# Patient Record
Sex: Male | Born: 1975 | Hispanic: No | Marital: Single | Smoking: Never smoker
Health system: Southern US, Community
[De-identification: ages and names within clinical notes are randomized; demographics above are authoritative.]

## PROBLEM LIST (undated history)

## (undated) DIAGNOSIS — T7840XA Allergy, unspecified, initial encounter: Secondary | ICD-10-CM

## (undated) DIAGNOSIS — K219 Gastro-esophageal reflux disease without esophagitis: Secondary | ICD-10-CM

## (undated) HISTORY — DX: Gastro-esophageal reflux disease without esophagitis: K21.9

## (undated) HISTORY — DX: Allergy, unspecified, initial encounter: T78.40XA

---

## 2004-10-04 ENCOUNTER — Ambulatory Visit: Payer: Self-pay | Admitting: Family Medicine

## 2004-11-11 ENCOUNTER — Ambulatory Visit: Payer: Self-pay | Admitting: Family Medicine

## 2004-11-28 ENCOUNTER — Ambulatory Visit: Payer: Self-pay | Admitting: Family Medicine

## 2006-02-07 ENCOUNTER — Ambulatory Visit: Payer: Self-pay | Admitting: Family Medicine

## 2006-12-10 ENCOUNTER — Ambulatory Visit: Payer: Self-pay | Admitting: Family Medicine

## 2007-11-29 ENCOUNTER — Encounter: Payer: Self-pay | Admitting: Internal Medicine

## 2007-11-29 ENCOUNTER — Ambulatory Visit: Payer: Self-pay | Admitting: Family Medicine

## 2007-12-06 ENCOUNTER — Ambulatory Visit: Payer: Self-pay | Admitting: Family Medicine

## 2007-12-09 ENCOUNTER — Telehealth: Payer: Self-pay | Admitting: Family Medicine

## 2008-01-27 ENCOUNTER — Ambulatory Visit: Payer: Self-pay | Admitting: Family Medicine

## 2008-01-27 LAB — CONVERTED CEMR LAB
Bilirubin Urine: NEGATIVE
Glucose, Urine, Semiquant: NEGATIVE
Ketones, urine, test strip: NEGATIVE
Urobilinogen, UA: 0.2
pH: 5

## 2010-04-22 ENCOUNTER — Ambulatory Visit: Payer: Self-pay | Admitting: Family Medicine

## 2010-07-11 ENCOUNTER — Ambulatory Visit: Payer: Self-pay | Admitting: Family Medicine

## 2010-08-15 ENCOUNTER — Ambulatory Visit: Payer: Self-pay | Admitting: Family Medicine

## 2010-11-16 NOTE — Assessment & Plan Note (Signed)
Summary: consult re: sleepy problems/cjr   Vital Signs:  Patient profile:   35 year old male Weight:      186 pounds Temp:     98.0 degrees F oral BP sitting:   120 / 80  (left arm) Cuff size:   regular  Vitals Entered By: Kathrynn Speed CMA (April 22, 2010 10:45 AM) CC: Consult sleep problems, problems going to sleep, wakes up sometime gasping for air, going on 4 mths /src   CC:  Consult sleep problems, problems going to sleep, wakes up sometime gasping for air, and going on 4 mths /src.  History of Present Illness: Tanner Bradshaw is a 35 year old, married male, nonsmoker, who comes in with a 15-month history of episodes of waking up at night short of breath.  He says every two weeks or so to wake up at night and feel like he can't breathe.  He has to gasp for air.  He has a slender build weight 186.  No history of airway, reflux, esophagitis, etc., etc.  Current Medications (verified): 1)  None  Allergies (verified): No Known Drug Allergies  Past History:  Past medical, surgical, family and social histories (including risk factors) reviewed, and no changes noted (except as noted below).  Past Medical History: Reviewed history from 06/24/2007 and no changes required. Unremarkable  Past Surgical History: Reviewed history from 06/24/2007 and no changes required. Denies surgical history  Family History: Reviewed history and no changes required.  Social History: Reviewed history from 11/29/2007 and no changes required. Occupation: Never Smoked Alcohol use-no Drug use-no  Review of Systems      See HPI  Physical Exam  General:  Well-developed,well-nourished,in no acute distress; alert,appropriate and cooperative throughout examination Head:  Normocephalic and atraumatic without obvious abnormalities. No apparent alopecia or balding. Eyes:  No corneal or conjunctival inflammation noted. EOMI. Perrla. Funduscopic exam benign, without hemorrhages, exudates or papilledema.  Vision grossly normal. Ears:  External ear exam shows no significant lesions or deformities.  Otoscopic examination reveals clear canals, tympanic membranes are intact bilaterally without bulging, retraction, inflammation or discharge. Hearing is grossly normal bilaterally. Nose:  External nasal examination shows no deformity or inflammation. Nasal mucosa are pink and moist without lesions or exudates. Mouth:  Oral mucosa and oropharynx without lesions or exudates.  Teeth in good repair. Neck:  No deformities, masses, or tenderness noted. Lungs:  Normal respiratory effort, chest expands symmetrically. Lungs are clear to auscultation, no crackles or wheezes.   Impression & Recommendations:  Problem # 1:  UNSPECIFIED SLEEP APNEA (ICD-780.57) Assessment New  Orders: Pulmonary Referral (Pulmonary)  Patient Instructions: 1)  take 20 mg of Prilosec prior to your evening meal, nothing to eat or drink for 3 hours before bedtime, avoid all caffeine, and peppermint, sleep on two pillows, and we will get you set up for a pulmonary consult ASAP

## 2010-11-16 NOTE — Assessment & Plan Note (Signed)
Summary: SINUSITIS? // RS   Vital Signs:  Patient profile:   35 year old male Height:      60 inches Weight:      186 pounds BMI:     36.46 Temp:     98.4 degrees F oral BP sitting:   110 / 80  (left arm) Cuff size:   regular  Vitals Entered By: Kern Reap CMA Duncan Dull) (August 15, 2010 1:41 PM) CC: sinus pressure   CC:  sinus pressure.  History of Present Illness: Tanner Bradshaw is a 71 -year-old, married male, nonsmoker, who comes in today because of allergic rhinitis.  He never had problems like this in the fall before however, this fall.  He said working around a lot of dust.  Review of systems negative  Allergies: No Known Drug Allergies  Past History:  Past medical, surgical, family and social histories (including risk factors) reviewed for relevance to current acute and chronic problems.  Past Medical History: Reviewed history from 06/24/2007 and no changes required. Unremarkable  Past Surgical History: Reviewed history from 06/24/2007 and no changes required. Denies surgical history  Family History: Reviewed history and no changes required.  Social History: Reviewed history from 11/29/2007 and no changes required. Occupation: Never Smoked Alcohol use-no Drug use-no  Review of Systems      See HPI  Physical Exam  General:  Well-developed,well-nourished,in no acute distress; alert,appropriate and cooperative throughout examination Head:  Normocephalic and atraumatic without obvious abnormalities. No apparent alopecia or balding. Eyes:  No corneal or conjunctival inflammation noted. EOMI. Perrla. Funduscopic exam benign, without hemorrhages, exudates or papilledema. Vision grossly normal. Ears:  External ear exam shows no significant lesions or deformities.  Otoscopic examination reveals clear canals, tympanic membranes are intact bilaterally without bulging, retraction, inflammation or discharge. Hearing is grossly normal bilaterally. Nose:  septum in the  midline, 4+ nasal edema Mouth:  Oral mucosa and oropharynx without lesions or exudates.  Teeth in good repair.   Problems:  Medical Problems Added: 1)  Dx of Rhinitis  (ICD-477.9)  Impression & Recommendations:  Problem # 1:  RHINITIS (ICD-477.9) Assessment New  His updated medication list for this problem includes:    Flonase 50 Mcg/act Susp (Fluticasone propionate) ..... Uad  Complete Medication List: 1)  Flonase 50 Mcg/act Susp (Fluticasone propionate) .... Uad  Patient Instructions: 1)  began plain Claritin, 10 mg in the morning .Marland Kitchen...or  plain Zyrtec 10 mg at bedtime. 2)  If after a week to 10 days.  He don't see much improvement, then add the steroid nasal spray, one shot up each nostril nightly Prescriptions: FLONASE 50 MCG/ACT SUSP (FLUTICASONE PROPIONATE) UAD  #1 x 11   Entered and Authorized by:   Roderick Pee MD   Signed by:   Roderick Pee MD on 08/15/2010   Method used:   Print then Give to Patient   RxID:   [id number]    Orders Added: 1)  Est. Patient Level III [14782]

## 2010-11-16 NOTE — Assessment & Plan Note (Signed)
Summary: CONSTANT LFT LEG PAIN/CJR   Vital Signs:  Patient profile:   35 year old male Weight:      189 pounds Temp:     97.9 degrees F BP sitting:   112 / 72  (right arm)  Vitals Entered By: Kathrynn Speed CMA (July 11, 2010 3:28 PM) CC: Constant pain in left leg x 4 days, pain in lower abd area started today, src Is Patient Diabetic? No   CC:  Constant pain in left leg x 4 days, pain in lower abd area started today, and src.  History of Present Illness: Tanner Bradshaw is a 35 year old male, who comes in today for pain in his left groin since last Thursday.  He did his normal workouts, Monday, Tuesday, Wednesday.  When he woke up Thursday morning and noticed pain in his left groin.  He does not recall doing anything unusual.  Review of systems  Preventive Screening-Counseling & Management  Alcohol-Tobacco     Smoking Status: never  Current Medications (verified): 1)  None  Allergies (verified): No Known Drug Allergies  Past History:  Past medical, surgical, family and social histories (including risk factors) reviewed for relevance to current acute and chronic problems.  Past Medical History: Reviewed history from 06/24/2007 and no changes required. Unremarkable  Past Surgical History: Reviewed history from 06/24/2007 and no changes required. Denies surgical history  Family History: Reviewed history and no changes required.  Social History: Reviewed history from 11/29/2007 and no changes required. Occupation: Never Smoked Alcohol use-no Drug use-no  Review of Systems      See HPI  Physical Exam  General:  Well-developed,well-nourished,in no acute distress; alert,appropriate and cooperative throughout examination Abdomen:  Bowel sounds positive,abdomen soft and non-tender without masses, organomegaly or hernias noted...........Marland Kitchentender to palpation left inguinal ligament.  No masses.  No hernias Genitalia:  Testes bilaterally descended without nodularity,  tenderness or masses. No scrotal masses or lesions. No penis lesions or urethral discharge.   Impression & Recommendations:  Problem # 1:  INGUINAL PAIN, LEFT (ICD-789.09) Assessment New  Patient Instructions: 1)  hold off on your exercise program. 2)  Motrin 600 mg 3 times a day with food. 3)  Return p.r.n.

## 2010-12-29 ENCOUNTER — Ambulatory Visit (INDEPENDENT_AMBULATORY_CARE_PROVIDER_SITE_OTHER): Payer: 59 | Admitting: Family Medicine

## 2010-12-29 ENCOUNTER — Encounter: Payer: Self-pay | Admitting: Family Medicine

## 2010-12-29 VITALS — BP 130/90 | Temp 98.3°F | Ht 70.0 in | Wt 186.0 lb

## 2010-12-29 DIAGNOSIS — R1011 Right upper quadrant pain: Secondary | ICD-10-CM

## 2010-12-29 NOTE — Progress Notes (Signed)
  Subjective:    Patient ID: Tanner Bradshaw, male    DOB: 1976/06/21, 35 y.o.   MRN: [id number]  HPI Tanner Bradshaw Is a delightful 35 year old male, nonsmoker, who comes in today for evaluation of right upper quadrant abdominal pain x 1 month.  He states about a month ago he began having right upper quadrant abdominal pain after eating.  He describes the pain.  It is sudden onset, dull, a 5 on a scale of one to 10 no radiation.  It lasts for 30 minutes and goes away.  He has a lot of gas and belching.  No fever, chills.  Family history negative for gallbladder disease.  No changes except he stopped all lactose products thinking it might be a lactase deficiency.  However, the dietary changes did not help   Review of Systems Negative    Objective:   Physical Exam    Well-developed well-nourished, male in no acute distress.  Examination abdomen his abdomen is flat.  The bowel sounds are normal.  No tenderness.  No palpable masses    Assessment & Plan:  PC right upper quadrant pain, rule out gallbladder disease.  Fat free diet.  Ultrasound of his gallbladder.  Follow-up after ultrasound

## 2010-12-29 NOTE — Patient Instructions (Signed)
Stay on complete a fat-free diet.  See me in today after the ultrasound for follow

## 2010-12-30 LAB — HIV ANTIBODY (ROUTINE TESTING W REFLEX): HIV: NONREACTIVE

## 2011-01-02 ENCOUNTER — Other Ambulatory Visit: Payer: Self-pay | Admitting: Family Medicine

## 2011-01-02 DIAGNOSIS — R1011 Right upper quadrant pain: Secondary | ICD-10-CM

## 2011-01-02 NOTE — Progress Notes (Signed)
patient  Is aware 

## 2011-01-09 ENCOUNTER — Ambulatory Visit
Admission: RE | Admit: 2011-01-09 | Discharge: 2011-01-09 | Disposition: A | Discharge status: Home / Self Care | Payer: 59 | Source: Ambulatory Visit | Attending: Family Medicine | Admitting: Family Medicine

## 2011-01-09 DIAGNOSIS — R1011 Right upper quadrant pain: Secondary | ICD-10-CM

## 2011-09-20 ENCOUNTER — Telehealth: Payer: Self-pay | Admitting: Family Medicine

## 2011-09-20 NOTE — Telephone Encounter (Signed)
Wants to be seen. Having urinary pain. Went to urgent care about 2 weeks ago. Still not any better. Please advise. Thanks.

## 2011-09-20 NOTE — Telephone Encounter (Signed)
Spoke with patient and an appointment made 

## 2011-09-21 ENCOUNTER — Encounter: Payer: Self-pay | Admitting: Family Medicine

## 2011-09-21 ENCOUNTER — Ambulatory Visit (INDEPENDENT_AMBULATORY_CARE_PROVIDER_SITE_OTHER): Payer: 59 | Admitting: Family Medicine

## 2011-09-21 VITALS — BP 110/70 | Temp 98.0°F | Wt 182.5 lb

## 2011-09-21 DIAGNOSIS — R300 Dysuria: Secondary | ICD-10-CM

## 2011-09-21 DIAGNOSIS — R109 Unspecified abdominal pain: Secondary | ICD-10-CM

## 2011-09-21 NOTE — Progress Notes (Signed)
  Subjective:    Patient ID: Tanner Bradshaw, male    DOB: 12-26-1975, 35 y.o.   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  He states that two weeks ago.  He went to the urgent care and saw Dr. Merla Riches who prescribed Cipro 500 b.i.d. For 10 days because of dysuria.  Lab tests were pending, and nobody ever called him back the reports.  He called the office and still cannot get any information.  Despite taking the Cipro twice a day.  His symptoms of dysuria have persisted.  No fever, chills, rash, discharge, etc..  A week ago, he began having right and left lower quadrant abdominal pain.  He describes the pain is constant, right left lower quadrant dull, a 4 on a scale of one to 10.  It does not radiate.  He said no fever, chills, nausea, vomiting, or diarrhea.  Family history negative for GI and GU disease  Review of Systems    General urologic, and GI review of systems otherwise negative.  Despite the abdominal pain.  He is able to exercise on a regular basis and the pain does not keep him awake at night.  Nor has it gotten worse since it started Objective:   Physical Exam Well-developed well-nourished, male in no acute distress.  Examination the abdomen shows the abdomen is flat.  The bowel sounds are normal.  There is some tenderness in the descending colon and the sigmoid colon.  No palpable masses.  No rebound.  Genitalia exam normal       Assessment & Plan:  Dysuria unknown etiology.  Right and left lower quadrant abdominal pain, unknown etiology.  Plan start with a GI consult

## 2011-09-21 NOTE — Patient Instructions (Signed)
We will get you set up for a consult and GI.  Continue your good health habits

## 2011-10-19 ENCOUNTER — Encounter: Payer: Self-pay | Admitting: Internal Medicine

## 2011-10-19 ENCOUNTER — Ambulatory Visit (INDEPENDENT_AMBULATORY_CARE_PROVIDER_SITE_OTHER): Payer: 59 | Admitting: Internal Medicine

## 2011-10-19 VITALS — BP 124/80 | HR 60 | Ht 70.0 in | Wt 190.0 lb

## 2011-10-19 DIAGNOSIS — R1032 Left lower quadrant pain: Secondary | ICD-10-CM

## 2011-10-19 DIAGNOSIS — R109 Unspecified abdominal pain: Secondary | ICD-10-CM

## 2011-10-19 DIAGNOSIS — R1030 Lower abdominal pain, unspecified: Secondary | ICD-10-CM | POA: Insufficient documentation

## 2011-10-19 NOTE — Patient Instructions (Addendum)
Avoid abdominal exercise for another month at least. We will review records and give you a call, if we haven't called by the end of next week please give [place] a call. Things should get better with some time.

## 2011-10-19 NOTE — Progress Notes (Signed)
Subjective:    Patient ID: Tanner Bradshaw, male    DOB: [birth year], 36 y.o.   MRN: [id number]  HPI The patient is a 36 year old [place] American man here for evaluation of lower, no pain. He describes problems with lower abdominal pains and actually some painful urination that started I think in late November. [facility] he was prescribed Cipro after a urinalysis was performed. It sounds like they were going to send out for a culture as well. He says he could never get the results of that. He took Cipro, he saw Dr. Tawanna Cooler in early December. He was still having some dysuria and lower, pain then. He was referred to me. At this point his abdominal pain is better, though he still has some left groin pain. He denies any dysuria at this point. There's been no bowel habit change. There were no clear triggers for his pains and they do not occur at night or disturbing sleep. They are sharp and may last for up to an hour in variable positions in the lower abdominal area though recently had a transient pain in the right upper quadrant.  He tells me that in November before this started he was doing a lot more abdominal crutches and exercises, he works out most days of the week with weights and cardia workup. At that point he was actually doing a large number of abdominal crutches even before he went to the gym, sometimes twice a day. Subsequently he stopped those. And he thinks that after he stopped those as when he began to have improvement. His appetite and eating or unaffected and have no effect on the symptoms. Her been no fever chills or other constitutional symptoms. He has not tried any therapy for his pain other than the Cipro he took as prescribed.  Review of his history is notable for different musculoskeletal type pains including chest pains, he has had right upper quadrant pain earlier this year for which he had an ultrasound of the abdomen which was unremarkable. He's  had some anal itching which responded the preparation H. Call the GI review of systems is negative.   Review of Systems As above, he has had some muscle pains in his legs and extremities as well. All other review of systems negative.    Objective:   Physical Exam General:  Well-developed, well-nourished and in no acute distress Eyes:  anicteric. ENT:   Mouth and posterior pharynx free of lesions.  Neck:   supple w/o thyromegaly or mass.  Lungs: Clear to auscultation bilaterally. Heart:  S1S2, no rubs, murmurs, gallops. Abdomen:  soft, non-tender, no hepatosplenomegaly, hernia, or mass and BS+.  Rectal: Normal anoderm, no rectal mass, prostate is normal GU:  Normal circumcised penis and testes, no herniae but slight right inguinal bulge with cogh Lymph:  no cervical or supraclavicular adenopathy. Extremities:   no edema Skin   no rash. Neuro:  A&O x 3.  Psych:  appropriate mood and  Affect.   Data Reviewed:  Have requested office notes and labs from [facility]      Assessment & Plan:   1. Lower abdominal pain   2. Left groin pain    I think his problems are likely abdominal wall and groin strains from is exercising. Her being since he is stopped. I do want to see what the urinalysis showed. If he had a UTI he could need a urology referral. He has also  had some dysuria in the past. I reassured him, there really no worrisome features anything here and he is improving. Once I review the information from [facility] I will call him back. He will avoid abdominal crutches for at least another month.

## 2012-01-19 ENCOUNTER — Ambulatory Visit (INDEPENDENT_AMBULATORY_CARE_PROVIDER_SITE_OTHER): Payer: 59 | Admitting: Family

## 2012-01-19 ENCOUNTER — Encounter: Payer: Self-pay | Admitting: Family

## 2012-01-19 VITALS — BP 118/78 | Temp 98.7°F | Wt 190.0 lb

## 2012-01-19 DIAGNOSIS — J0100 Acute maxillary sinusitis, unspecified: Secondary | ICD-10-CM

## 2012-01-19 DIAGNOSIS — R0982 Postnasal drip: Secondary | ICD-10-CM

## 2012-01-19 MED ORDER — AZITHROMYCIN 250 MG PO TABS: ORAL_TABLET | ORAL | Status: AC

## 2012-01-19 NOTE — Patient Instructions (Signed)
1. Zyrtec-D twice a day (OTC)  Sinusitis Sinuses are air pockets within the bones of your face. The growth of bacteria within a sinus leads to infection. The infection prevents the sinuses from draining. This infection is called sinusitis. SYMPTOMS  There will be different areas of pain depending on which sinuses have become infected.  The maxillary sinuses often produce pain beneath the eyes.   Frontal sinusitis may cause pain in the middle of the forehead and above the eyes.  Other problems (symptoms) include:  Toothaches.   Colored, pus-like (purulent) drainage from the nose.   Swelling, warmth, and tenderness over the sinus areas may be signs of infection.  TREATMENT  Sinusitis is most often determined by an exam.X-rays may be taken. If x-rays have been taken, make sure you obtain your results or find out how you are to obtain them. Your caregiver may give you medications (antibiotics). These are medications that will help kill the bacteria causing the infection. You may also be given a medication (decongestant) that helps to reduce sinus swelling.  HOME CARE INSTRUCTIONS   Only take over-the-counter or prescription medicines for pain, discomfort, or fever as directed by your caregiver.   Drink extra fluids. Fluids help thin the mucus so your sinuses can drain more easily.   Applying either moist heat or ice packs to the sinus areas may help relieve discomfort.   Use saline nasal sprays to help moisten your sinuses. The sprays can be found at your local drugstore.  SEEK IMMEDIATE MEDICAL CARE IF:  You have a fever.   You have increasing pain, severe headaches, or toothache.   You have nausea, vomiting, or drowsiness.   You develop unusual swelling around the face or trouble seeing.  MAKE SURE YOU:   Understand these instructions.   Will watch your condition.   Will get help right away if you are not doing well or get worse.  Document Released: 10/02/2005 Document  Revised: 09/21/2011 Document Reviewed: 05/01/2007 [facility] Patient Information 2012 [place], [place].

## 2012-01-19 NOTE — Progress Notes (Signed)
  Subjective:    Patient ID: Tanner Bradshaw, male    DOB: [birth year], 36 y.o.   MRN: [id number]  HPI 36 year old [place] American male, nonsmoker, patient of Dr. Tawanna Cooler is in today with complaints of cough, congestion, headache, sinus pressure x10 days. He hasn't taken over-the-counter Mucinex D. with no relief. Denies any lightheadedness, dizziness, chest pain, palpitations, shortness of breath or edema.   Review of Systems  Constitutional: Positive for fatigue.  HENT: Positive for nosebleeds, congestion, sore throat and sinus pressure.   Respiratory: Negative.   Cardiovascular: Negative.   Musculoskeletal: Negative.   Skin: Negative.   Neurological: Negative.   Hematological: Positive for adenopathy.  Psychiatric/Behavioral: Negative.    No past medical history on file.  History   Social History  . Marital Status: Single    Spouse Name: N/A    Number of Children: 1  . 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   Social History Main Topics  . Smoking status: Never Smoker   . Smokeless tobacco: Never Used  . Alcohol Use: No  . Drug Use: No  . Sexually Active: Not on file   Other Topics Concern  . Not on file   Social History Narrative   No caffeine     No past surgical history on file.  Family History  Problem Relation Age of Onset  . Diabetes Maternal Aunt   . Colon cancer Neg Hx     No Known Allergies  No current outpatient prescriptions on file prior to visit.    BP 118/78  Temp(Src) 98.7 F (37.1 C) (Oral)  Wt 190 lb (86.183 kg)chart    Objective:   Physical Exam  Constitutional: He is oriented to person, place, and time. He appears well-developed and well-nourished.  HENT:  Right Ear: External ear normal.  Left Ear: External ear normal.  Nose: Nose normal.  Mouth/Throat: Oropharynx is clear and moist.       Sinus tenderness to palpation of the maxillary sinus  Neck: Normal range of motion. Neck supple.    Cardiovascular: Normal rate, regular rhythm and normal heart sounds.   Pulmonary/Chest: Effort normal and breath sounds normal.  Musculoskeletal: Normal range of motion.  Neurological: He is alert and oriented to person, place, and time.  Skin: Skin is warm and dry.  Psychiatric: He has a normal mood and affect.          Assessment & Plan:  Assessment: Acute sinusitis, postnasal drip  Plan: Over-the-counter Zyrtec-D twice daily. Z-Pak as directed. Rest. Drink plenty of fluids. Patient call the office if symptoms worsen or persist, recheck as scheduled and when necessary.

## 2012-03-27 ENCOUNTER — Encounter: Payer: Self-pay | Admitting: Family Medicine

## 2012-03-27 ENCOUNTER — Ambulatory Visit (INDEPENDENT_AMBULATORY_CARE_PROVIDER_SITE_OTHER): Payer: 59 | Admitting: Family Medicine

## 2012-03-27 VITALS — BP 130/90 | Temp 97.8°F | Wt 196.0 lb

## 2012-03-27 DIAGNOSIS — R300 Dysuria: Secondary | ICD-10-CM

## 2012-03-27 DIAGNOSIS — L74519 Primary focal hyperhidrosis, unspecified: Secondary | ICD-10-CM

## 2012-03-27 DIAGNOSIS — L74512 Primary focal hyperhidrosis, palms: Secondary | ICD-10-CM

## 2012-03-27 LAB — POCT URINALYSIS DIPSTICK
Bilirubin, UA: NEGATIVE
Glucose, UA: NEGATIVE
Leukocytes, UA: NEGATIVE
Nitrite, UA: NEGATIVE

## 2012-03-27 MED ORDER — SULFAMETHOXAZOLE-TRIMETHOPRIM 800-160 MG PO TABS: ORAL_TABLET | ORAL | Status: DC

## 2012-03-27 MED ORDER — ALUMINUM CHLORIDE 20 % EX SOLN: Freq: Every day | CUTANEOUS | Status: DC

## 2012-03-27 NOTE — Progress Notes (Signed)
  Subjective:    Patient ID: [name], male    DOB: 04-Oct-1976, 36 y.o.   MRN: [id number]  HPI Tavius is a 36 year old male nonsmoker who comes in today for evaluation of dysuria x2 weeks  He's had no fever chills or back pain discharge etc. He just has a burning sensation of his urethra.  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

## 2012-03-27 NOTE — Patient Instructions (Signed)
Take the Septra one twice daily to bottle empty refills x1  Drysol small amounts once daily at bedtime

## 2012-05-06 ENCOUNTER — Encounter: Payer: Self-pay | Admitting: Family

## 2012-05-06 ENCOUNTER — Ambulatory Visit (INDEPENDENT_AMBULATORY_CARE_PROVIDER_SITE_OTHER): Payer: 59 | Admitting: Family

## 2012-05-06 VITALS — BP 110/80 | HR 64 | Temp 98.4°F | Wt 193.0 lb

## 2012-05-06 DIAGNOSIS — J029 Acute pharyngitis, unspecified: Secondary | ICD-10-CM

## 2012-05-06 DIAGNOSIS — K219 Gastro-esophageal reflux disease without esophagitis: Secondary | ICD-10-CM

## 2012-05-06 MED ORDER — OMEPRAZOLE 40 MG PO CPDR: 40.0000 mg | DELAYED_RELEASE_CAPSULE | Freq: Every day | ORAL | Status: DC

## 2012-05-06 NOTE — Patient Instructions (Signed)
Diet for GERD or PUD Nutrition therapy can help ease the discomfort of gastroesophageal reflux disease (GERD) and peptic ulcer disease (PUD).  HOME CARE INSTRUCTIONS   Eat your meals slowly, in a relaxed setting.   Eat 5 to 6 small meals per day.   If a food causes distress, stop eating it for a period of time.  FOODS TO AVOID  Coffee, regular or decaffeinated.   Cola beverages, regular or low calorie.   Tea, regular or decaffeinated.   Pepper.   Cocoa.   High fat foods, including meats.   Butter, margarine, hydrogenated oil (trans fats).   Peppermint or spearmint (if you have GERD).   Fruits and vegetables if not tolerated.   Alcohol.   Nicotine (smoking or chewing). This is one of the most potent stimulants to acid production in the gastrointestinal tract.   Any food that seems to aggravate your condition.  If you have questions regarding your diet, ask your caregiver or a registered dietitian. TIPS  Lying flat may make symptoms worse. Keep the head of your bed raised 6 to 9 inches (15 to 23 cm) by using a foam wedge or blocks under the legs of the bed.   Do not lay down until 3 hours after eating a meal.   Daily physical activity may help reduce symptoms.  MAKE SURE YOU:   Understand these instructions.   Will watch your condition.   Will get help right away if you are not doing well or get worse.  Document Released: 10/02/2005 Document Revised: 09/21/2011 Document Reviewed: 08/18/2011 [employer] Patient Information 2012 [employer]. 

## 2012-05-06 NOTE — Progress Notes (Signed)
  Subjective:    Patient ID: Tanner Bradshaw, male    DOB: 08/17/1976, 36 y.o.   MRN: [id number]  HPI 36 year old African American male, nonsmoker, patient of Dr. Tawanna Cooler is in today with complaints of feeling like he has a lump in the back of his throat. He also complains of some soreness. Has been having trouble controlling his reflux over the past one week. Believes it was triggered by spicy foods. He has seen increase in belching and burping but denies any fullness or bloating. Denies any sneezing coughing or congestion. Denies stress. Minimal caffeine intake.   Review of Systems  Constitutional: Negative.   HENT: Positive for sore throat. Negative for congestion, sneezing and postnasal drip.   Respiratory: Negative.   Cardiovascular: Negative.   Gastrointestinal: Negative for nausea, vomiting, diarrhea and constipation.  Musculoskeletal: Negative.   Skin: Negative.   Neurological: Negative.   Hematological: Negative.   Psychiatric/Behavioral: Negative.    No past medical history on file.  History   Social History  . Marital Status: Single    Spouse Name: N/A    Number of Children: 1  . 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   Social History Main Topics  . Smoking status: Never Smoker   . Smokeless tobacco: Never Used  . Alcohol Use: No  . Drug Use: No  . Sexually Active: Not on file   Other Topics Concern  . Not on file   Social History Narrative   No caffeine     No past surgical history on file.  Family History  Problem Relation Age of Onset  . Diabetes Maternal Aunt   . Colon cancer Neg Hx     No Known Allergies  Current Outpatient Prescriptions on File Prior to Visit  Medication Sig Dispense Refill  . aluminum chloride (DRYSOL) 20 % external solution Apply topically at bedtime.  60 mL  5  . omeprazole (PRILOSEC) 40 MG capsule Take 1 capsule (40 mg total) by mouth daily.  30 capsule  3  .  sulfamethoxazole-trimethoprim (BACTRIM DS,SEPTRA DS) 800-160 MG per tablet 1 by mouth twice a day to bottle empty  30 tablet  1    BP 110/80  Pulse 64  Temp 98.4 F (36.9 C) (Oral)  Wt 193 lb (87.544 kg)  SpO2 98%chart    Objective:   Physical Exam  Constitutional: He is oriented to person, place, and time. He appears well-developed and well-nourished.  HENT:  Right Ear: External ear normal.  Left Ear: External ear normal.       Pharynx moderately red but no exudate  Neck: Normal range of motion. Neck supple. No thyromegaly present.  Cardiovascular: Normal rate, regular rhythm and normal heart sounds.   Pulmonary/Chest: Effort normal and breath sounds normal.  Abdominal: Soft. Bowel sounds are normal.  Musculoskeletal: Normal range of motion.  Lymphadenopathy:    He has no cervical adenopathy.  Neurological: He is alert and oriented to person, place, and time.  Skin: Skin is warm and dry.  Psychiatric: He has a normal mood and affect.          Assessment & Plan:  Assessment: Pharyngitis likely a result of GERD  Plan: Omeprazole 40 mg once daily. Advise patient to call the office if symptoms worsen or persist. We'll consider an endoscopy if his symptoms persist. Recheck with Dr. Tawanna Cooler for complete physical exam in a month and sooner when necessary.

## 2012-05-15 ENCOUNTER — Other Ambulatory Visit: Payer: Self-pay | Admitting: Otolaryngology

## 2012-05-15 DIAGNOSIS — R6889 Other general symptoms and signs: Secondary | ICD-10-CM

## 2012-05-15 DIAGNOSIS — K219 Gastro-esophageal reflux disease without esophagitis: Secondary | ICD-10-CM

## 2012-05-15 DIAGNOSIS — J029 Acute pharyngitis, unspecified: Secondary | ICD-10-CM

## 2012-05-22 ENCOUNTER — Ambulatory Visit
Admission: RE | Admit: 2012-05-22 | Discharge: 2012-05-22 | Disposition: A | Discharge status: Home / Self Care | Payer: 59 | Source: Ambulatory Visit | Attending: Otolaryngology | Admitting: Otolaryngology

## 2012-05-22 DIAGNOSIS — R6889 Other general symptoms and signs: Secondary | ICD-10-CM

## 2012-05-22 DIAGNOSIS — J029 Acute pharyngitis, unspecified: Secondary | ICD-10-CM

## 2012-05-22 DIAGNOSIS — K219 Gastro-esophageal reflux disease without esophagitis: Secondary | ICD-10-CM

## 2012-07-12 ENCOUNTER — Ambulatory Visit: Payer: 59 | Admitting: Internal Medicine

## 2012-08-29 ENCOUNTER — Other Ambulatory Visit: Payer: Self-pay | Admitting: Family

## 2012-12-18 ENCOUNTER — Encounter (HOSPITAL_COMMUNITY): Payer: Self-pay | Admitting: *Deleted

## 2012-12-18 ENCOUNTER — Emergency Department (HOSPITAL_COMMUNITY)
Admission: EM | Admit: 2012-12-18 | Discharge: 2012-12-18 | Disposition: A | Discharge status: Home / Self Care | Payer: 59 | Source: Home / Self Care

## 2012-12-18 ENCOUNTER — Telehealth: Payer: Self-pay | Admitting: Family Medicine

## 2012-12-18 DIAGNOSIS — R42 Dizziness and giddiness: Secondary | ICD-10-CM

## 2012-12-18 DIAGNOSIS — R51 Headache: Secondary | ICD-10-CM

## 2012-12-18 DIAGNOSIS — R0789 Other chest pain: Secondary | ICD-10-CM

## 2012-12-18 DIAGNOSIS — N289 Disorder of kidney and ureter, unspecified: Secondary | ICD-10-CM

## 2012-12-18 DIAGNOSIS — I10 Essential (primary) hypertension: Secondary | ICD-10-CM

## 2012-12-18 LAB — POCT I-STAT, CHEM 8
Creatinine, Ser: 1.4 mg/dL — ABNORMAL HIGH (ref 0.50–1.35)
Hemoglobin: 16 g/dL (ref 13.0–17.0)

## 2012-12-18 NOTE — ED Notes (Signed)
C/o pain in L chest onset last Wednesday.  He states he was working out last Wed. Lifting weights and does not know if he pulled something.  Pain is dull but worse on palpation.  Had a dizzy spell on Thur and Sat. that lasted 1 minute, separate from the pain.  Taking Advil.  Feels lightheaded today.

## 2012-12-18 NOTE — ED Provider Notes (Signed)
History     CSN: [id number]  Arrival date & time 12/18/12  1538   First MD Initiated Contact with Patient 12/18/12 1638      Chief Complaint  Patient presents with  . Chest Pain    (Consider location/radiation/quality/duration/timing/severity/associated sxs/prior treatment) Patient is a 37 y.o. male presenting with chest pain.  Chest Pain Associated symptoms: no dizziness, no headache, no numbness and no weakness    This is a 37 year old male who presents essentially for a complaint of feeling lightheaded today. He had 2 spells of dizziness over the past week both occurring after he stood up from a seated position and walked. These spells were not associated with any shortness of breath, acute chest pain or palpitations. He did not feel like he was going to pass out. Today, he states, he was not dizzy but felt some lightheadedness throughout the day. He states he drinks a lot of water and urinates very frequently. He does not take any caffeinated beverages. He has never been told he has high blood pressure He is not having nausea vomiting diarrhea. He does not have any flulike symptoms or sinus issues. No ringing in his ears or earache. No dizziness upon turning his head. No headaches. He also states that he thinks he pulled a muscle while at the gym last week and has been having left-sided chest pain which is reproducible when palpated. He has stopped going to the gym and was taking one tablet of ibuprofen daily for 2 days but stopped it because it was not helping. This is a soreness which is present constantly and worse when he lays on the left side of his chest or elevates his left arm. He does have some occasional tingling in his left arm as well. He has not had any as tightness associated with shortness of breath diaphoresis or palpitations.  History reviewed. No pertinent past medical history.  History reviewed. No pertinent past surgical history.  Family History  Problem Relation  Age of Onset  . Diabetes Maternal Aunt   . Colon cancer Neg Hx     History  Substance Use Topics  . Smoking status: Never Smoker   . Smokeless tobacco: Never Used  . Alcohol Use: No      Review of Systems  Constitutional: Negative.   HENT: Negative.   Respiratory: Negative.   Cardiovascular: Positive for chest pain.  Gastrointestinal: Negative.   Genitourinary: Negative.   Musculoskeletal:       Left chest pain which is reproducible and exacerbated by certain movements  Skin: Negative.   Neurological: Positive for light-headedness. Negative for dizziness, tremors, seizures, syncope, facial asymmetry, speech difficulty, weakness, numbness and headaches.  Hematological: Negative.   Psychiatric/Behavioral: Negative.     Allergies  Review of patient's allergies indicates no known allergies.  Home Medications   Current Outpatient Rx  Name  Route  Sig  Dispense  Refill  . aluminum chloride (DRYSOL) 20 % external solution   Topical   Apply topically at bedtime.   60 mL   5   . omeprazole (PRILOSEC) 40 MG capsule      TAKE 1 CAPSULE (40 MG TOTAL) BY MOUTH DAILY.   30 capsule   3   . sulfamethoxazole-trimethoprim (BACTRIM DS,SEPTRA DS) 800-160 MG per tablet      1 by mouth twice a day to bottle empty   30 tablet   1     BP 136/90  Pulse 61  Temp(Src) 98.6 F (37 C) (Oral)  Resp 16  SpO2 98%  Physical Exam  ED Course  Procedures (including critical care time)  Labs Reviewed  POCT I-STAT, CHEM 8 - Abnormal; Notable for the following:    Creatinine, Ser 1.40 (*)    Calcium, Ion 1.29 (*)    All other components within normal limits   No results found.   1. Renal insufficiency   2. Light headedness   3. Chest pain, muscular   4. HTN (hypertension)       MDM  I am recommending that he followup with his family doctor in one week for a recheck of blood pressure and to compare current blood work which reveals renal insufficiency with prior blood  work. Lightheaded sensation as possibly coming from uncontrolled blood pressure but I am hesitant to start medications for him today and would like him to have a recheck. In regards to the muscular chest pain I recommended rest ice and heat and topical medication such as BenGay. At this point I have told him not to take any further NSAIDs.        Calvert Cantor, MD 12/18/12 [phone]

## 2012-12-18 NOTE — Telephone Encounter (Signed)
I spoke with pt and advised him to go to the ER, per Dr. Clent Ridges.

## 2012-12-18 NOTE — Telephone Encounter (Signed)
Patient Information:  Caller Name: Jaymond  Phone: [phone]  Patient: Tanner Bradshaw, Tanner Bradshaw  Gender: Male  DOB: 05-24-1976  Age: 37 Years  PCP: Kelle Darting [facility])  Office Follow Up:  Does the office need to follow up with this patient?: Yes  Instructions For The Office: Caller requesting office appt after 1 pm  vs ED.  Maybe reached @ [phone].  Thank You.  RN Note:  Caller rquesting an appt.  Advised would like him seen asap.  Caller at work and cannot make to office before 1pm.  Advised caller he may be instructed to go to ED.   Symptoms  Reason For Call & Symptoms: Chest Pain on an off for about a 1 week.  Started getting light headed the past 2 days.  Chest pain on left uppper chest area.  Tender to the touch.  No shortness of breath.   Reviewed Health History In EMR: Yes  Reviewed Medications In EMR: Yes  Reviewed Allergies In EMR: Yes  Reviewed Surgeries / Procedures: Yes  Date of Onset of Symptoms: 12/11/2012  Treatments Tried: Advil  Treatments Tried Worked: No  Guideline(s) Used:  Chest Pain  Disposition Per Guideline:   Go to ED Now (or to Office with PCP Approval)  Reason For Disposition Reached:   Dizziness or lightheadedness  Advice Given:  Call Back If:  Severe chest pain  Constant chest pain lasting longer than 5 minutes  Difficulty breathing  You become worse.

## 2012-12-26 ENCOUNTER — Encounter: Payer: Self-pay | Admitting: Internal Medicine

## 2012-12-26 ENCOUNTER — Ambulatory Visit (INDEPENDENT_AMBULATORY_CARE_PROVIDER_SITE_OTHER): Payer: 59 | Admitting: Internal Medicine

## 2012-12-26 VITALS — BP 130/90 | HR 64 | Temp 98.0°F | Resp 18 | Wt 199.0 lb

## 2012-12-26 DIAGNOSIS — R42 Dizziness and giddiness: Secondary | ICD-10-CM

## 2012-12-26 DIAGNOSIS — R51 Headache: Secondary | ICD-10-CM

## 2012-12-26 NOTE — Progress Notes (Signed)
Subjective:    Patient ID: Tanner Bradshaw, male    DOB: 1975-12-07, 37 y.o.   MRN: [id number]  HPI   37 year old patient who is seen today in followup. He was seen at the urgent care 8 days ago complaining of headaches and dizziness. He was noted have a slightly elevated blood pressure as well as a creatinine of 1.4. At the present time his headaches have resolved and he only has some very mild nonspecific lightheadedness. This also seems to be improving. He takes no chronic medications. He does spend time at her health club and takes a number of supplements including protein supplements. He does not feel the protein supplements include creatine but he is unsure. Prior to his urgent care visit he was taken melatonin which he has discontinued. No family history of hypertension or chronic kidney disease  History reviewed. No pertinent past medical history.  History   Social History  . Marital Status: Single    Spouse Name: N/A    Number of Children: 1  . 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   Social History Main Topics  . Smoking status: Never Smoker   . Smokeless tobacco: Never Used  . Alcohol Use: No  . Drug Use: No  . Sexually Active: Not on file   Other Topics Concern  . Not on file   Social History Narrative   No caffeine     History reviewed. No pertinent past surgical history.  Family History  Problem Relation Age of Onset  . Diabetes Maternal Aunt   . Colon cancer Neg Hx     No Known Allergies  Current Outpatient Prescriptions on File Prior to Visit  Medication Sig Dispense Refill  . omeprazole (PRILOSEC) 40 MG capsule TAKE 1 CAPSULE (40 MG TOTAL) BY MOUTH DAILY.  30 capsule  3   No current facility-administered medications on file prior to visit.    BP 130/90  Pulse 64  Temp(Src) 98 F (36.7 C) (Oral)  Resp 18  Wt 199 lb (90.266 kg)  BMI 28.55 kg/m2  SpO2 99%       Review of Systems  Constitutional:  Negative for fever, chills, appetite change and fatigue.  HENT: Negative for hearing loss, ear pain, congestion, sore throat, trouble swallowing, neck stiffness, dental problem, voice change and tinnitus.   Eyes: Negative for pain, discharge and visual disturbance.  Respiratory: Negative for cough, chest tightness, wheezing and stridor.   Cardiovascular: Negative for chest pain, palpitations and leg swelling.  Gastrointestinal: Negative for nausea, vomiting, abdominal pain, diarrhea, constipation, blood in stool and abdominal distention.  Genitourinary: Negative for urgency, hematuria, flank pain, discharge, difficulty urinating and genital sores.  Musculoskeletal: Negative for myalgias, back pain, joint swelling, arthralgias and gait problem.  Skin: Negative for rash.  Neurological: Positive for light-headedness and headaches. Negative for dizziness, syncope, speech difficulty, weakness and numbness.  Hematological: Negative for adenopathy. Does not bruise/bleed easily.  Psychiatric/Behavioral: Negative for behavioral problems and dysphoric mood. The patient is not nervous/anxious.        Objective:   Physical Exam  Constitutional: He is oriented to person, place, and time. He appears well-developed and well-nourished. No distress.  Muscular athletic build.  Blood pressure 130/90  HENT:  Head: Normocephalic.  Right Ear: External ear normal.  Left Ear: External ear normal.  Eyes: Conjunctivae and EOM are normal.  Neck: Normal range of motion.  Cardiovascular: Normal rate and normal heart sounds.  Pulmonary/Chest: Breath sounds normal.  Abdominal: Bowel sounds are normal.  Musculoskeletal: Normal range of motion. He exhibits no edema and no tenderness.  Neurological: He is alert and oriented to person, place, and time.  Psychiatric: He has a normal mood and affect. His behavior is normal.          Assessment & Plan:   Nonspecific lightheadedness. This seems to be improving  possible resolving viral labyrinthitis History of elevated creatinine 1.4. The patient has been asked to discontinue all supplements.  Patient probably runs a high normal creatinine based on his athletic muscular build. We'll asked return in one month for followup of his blood pressure and to repeat renal indices  Patient asked to monitor home blood pressure readings. Blood pressure reassessed in one month Low-salt diet recommended

## 2012-12-26 NOTE — Patient Instructions (Addendum)
Limit your sodium (Salt) intake  Please check your blood pressure on a regular basis.  If it is consistently greater than 150/90, please make an office appointment.  Followup with Dr. Tawanna Cooler in one month  Discontinue all protein supplements

## 2013-02-06 ENCOUNTER — Ambulatory Visit: Payer: 59 | Admitting: Family Medicine

## 2013-02-15 ENCOUNTER — Ambulatory Visit (INDEPENDENT_AMBULATORY_CARE_PROVIDER_SITE_OTHER): Payer: 59 | Admitting: Internal Medicine

## 2013-02-15 VITALS — BP 157/77 | HR 61 | Temp 98.0°F | Resp 16 | Ht 70.5 in | Wt 193.0 lb

## 2013-02-15 DIAGNOSIS — K1379 Other lesions of oral mucosa: Secondary | ICD-10-CM

## 2013-02-15 DIAGNOSIS — H9209 Otalgia, unspecified ear: Secondary | ICD-10-CM

## 2013-02-15 DIAGNOSIS — K1370 Unspecified lesions of oral mucosa: Secondary | ICD-10-CM

## 2013-02-15 DIAGNOSIS — H9202 Otalgia, left ear: Secondary | ICD-10-CM

## 2013-02-15 MED ORDER — IBUPROFEN 600 MG PO TABS: 600.0000 mg | ORAL_TABLET | Freq: Three times a day (TID) | ORAL | Status: DC | PRN

## 2013-02-15 MED ORDER — AMOXICILLIN 500 MG PO CAPS: 1000.0000 mg | ORAL_CAPSULE | Freq: Two times a day (BID) | ORAL | Status: DC

## 2013-02-15 NOTE — Patient Instructions (Signed)

## 2013-02-15 NOTE — Progress Notes (Signed)
  Subjective:    Patient ID: Tanner Bradshaw, male    DOB: 05/16/1976, 37 y.o.   MRN: [id number]  HPI Patient presents today with left sided dental pain and earache.  Patient believes he has an infection in his teeth on left side.  Patient states the pain has persisted since Monday or Tuesday.  Patient was using ibuprofen to ease the pain but it only eased the pain slightly.  Patient states this happened before a few years ago and he got an antibiotic and it cleared up the pain.    Review of Systems     Objective:   Physical Exam        Assessment & Plan:

## 2013-02-15 NOTE — Progress Notes (Signed)
  Subjective:    Patient ID: Tanner Bradshaw, male    DOB: [birth year], 37 y.o.   MRN: [id number]  HPI Hx of dental pain in past same area. No swelling, bleeding, fever. No signs of cavity. Pain is in area where wisdom tooth removed years ago.   Review of Systems healthy    Objective:   Physical Exam  Constitutional: He is oriented to person, place, and time. He appears well-developed and well-nourished.  HENT:  Right Ear: External ear normal.  Left Ear: External ear normal.  Nose: Nose normal.  Mouth/Throat: Oropharynx is clear and moist.  Eyes: Pupils are equal, round, and reactive to light.  Neck: Normal range of motion. Neck supple.  Cardiovascular: Normal rate.   Pulmonary/Chest: Effort normal.  Lymphadenopathy:    He has no cervical adenopathy.  Neurological: He is alert and oriented to person, place, and time. He exhibits normal muscle tone. Coordination normal.  Skin: Skin is warm. No rash noted. No erythema.  Psychiatric: He has a normal mood and affect.  Tender to palpate mandible at area, gums tender not red or swollen        Assessment & Plan:  Amoxil/Motrin See dentist

## 2013-07-16 ENCOUNTER — Ambulatory Visit (INDEPENDENT_AMBULATORY_CARE_PROVIDER_SITE_OTHER): Payer: 59 | Admitting: Family

## 2013-07-16 ENCOUNTER — Encounter: Payer: Self-pay | Admitting: Family

## 2013-07-16 VITALS — BP 120/64 | HR 72 | Wt 195.0 lb

## 2013-07-16 DIAGNOSIS — S29011A Strain of muscle and tendon of front wall of thorax, initial encounter: Secondary | ICD-10-CM

## 2013-07-16 DIAGNOSIS — IMO0002 Reserved for concepts with insufficient information to code with codable children: Secondary | ICD-10-CM

## 2013-07-16 MED ORDER — DICLOFENAC SODIUM 75 MG PO TBEC: 75.0000 mg | DELAYED_RELEASE_TABLET | Freq: Two times a day (BID) | ORAL | Status: DC

## 2013-07-16 MED ORDER — CYCLOBENZAPRINE HCL 10 MG PO TABS: 10.0000 mg | ORAL_TABLET | Freq: Three times a day (TID) | ORAL | Status: DC | PRN

## 2013-07-16 NOTE — Patient Instructions (Addendum)

## 2013-07-17 ENCOUNTER — Encounter: Payer: Self-pay | Admitting: Family

## 2013-07-17 NOTE — Progress Notes (Signed)
  Subjective:    Patient ID: Tanner Bradshaw, male    DOB: [birth year], 37 y.o.   MRN: [id number]  HPI 37 year old AAM, nonsmoker, is in today with c/o left chest wall pain x 1 week after intense weight lifting. Pain is worse with movement. Better with ice. Rates pain 5-6/10. Has not taken any OTC medications.    Review of Systems  Constitutional: Negative.   Respiratory: Negative.   Cardiovascular: Negative for palpitations and leg swelling.  Musculoskeletal: Positive for myalgias.       Left chest wall pain  Skin: Negative.   Neurological: Negative.   Psychiatric/Behavioral: Negative.    Past Medical History  Diagnosis Date  . Allergy   . GERD (gastroesophageal reflux disease)     History   Social History  . Marital Status: Single    Spouse Name: N/A    Number of Children: 1  . 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   Social History Main Topics  . Smoking status: Never Smoker   . Smokeless tobacco: Never Used  . Alcohol Use: No  . Drug Use: No  . Sexual Activity: Not on file   Other Topics Concern  . Not on file   Social History Narrative   No caffeine     History reviewed. No pertinent past surgical history.  Family History  Problem Relation Age of Onset  . Diabetes Maternal Aunt   . Colon cancer Neg Hx     No Known Allergies  Current Outpatient Prescriptions on File Prior to Visit  Medication Sig Dispense Refill  . omeprazole (PRILOSEC) 40 MG capsule TAKE 1 CAPSULE (40 MG TOTAL) BY MOUTH DAILY.  30 capsule  3  . amoxicillin (AMOXIL) 500 MG capsule Take 2 capsules (1,000 mg total) by mouth 2 (two) times daily.  40 capsule  0  . Ascorbic Acid (VITAMIN C) 1000 MG tablet Take 1,000 mg by mouth daily.      Marland Kitchen ibuprofen (ADVIL,MOTRIN) 600 MG tablet Take 1 tablet (600 mg total) by mouth every 8 (eight) hours as needed for pain.  30 tablet  0  . Multiple Vitamin (MULTI-VITAMINS PO) Take 1 tablet by mouth daily.       No  current facility-administered medications on file prior to visit.    BP 120/64  Pulse 72  Wt 195 lb (88.451 kg)  BMI 27.57 kg/m2chart     Objective:   Physical Exam  Constitutional: He is oriented to person, place, and time. He appears well-developed and well-nourished.  Neck: Normal range of motion. Neck supple.  Cardiovascular: Normal rate, regular rhythm and normal heart sounds.   Pulmonary/Chest: Effort normal and breath sounds normal.  Musculoskeletal: He exhibits tenderness.  Left chest wall pain to palpation. Pain with chest flexion and extension.   Neurological: He is alert and oriented to person, place, and time.  Skin: Skin is warm and dry.  Psychiatric: He has a normal mood and affect.          Assessment & Plan:  Assessment: 1. Left Chest Wall Pain 2. Muscle Strain  Plan: Flexeril 10mg  three times a day and Voltaren 75mg  twice a day. Call the office if symptoms worsen or persist. Recheck as scheduled and as needed. Ice to the AA twice a day x 15-20.

## 2013-11-25 ENCOUNTER — Encounter: Payer: Self-pay | Admitting: *Deleted

## 2013-11-25 ENCOUNTER — Other Ambulatory Visit (INDEPENDENT_AMBULATORY_CARE_PROVIDER_SITE_OTHER): Payer: 59

## 2013-11-25 DIAGNOSIS — Z0000 Encounter for general adult medical examination without abnormal findings: Secondary | ICD-10-CM

## 2013-11-25 LAB — LIPID PANEL
Cholesterol: 134 mg/dL (ref 0–200)
HDL: 36.2 mg/dL — AB (ref >39.00)
LDL Cholesterol: 79 mg/dL (ref 0–99)
TRIGLYCERIDES: 92 mg/dL (ref 0.0–149.0)
Total CHOL/HDL Ratio: 4
VLDL: 18.4 mg/dL (ref 0.0–40.0)

## 2013-11-25 LAB — HEPATIC FUNCTION PANEL
ALBUMIN: 4.3 g/dL (ref 3.5–5.2)
ALK PHOS: 76 U/L (ref 39–117)
ALT: 39 U/L (ref 0–53)
AST: 29 U/L (ref 0–37)
BILIRUBIN DIRECT: 0 mg/dL (ref 0.0–0.3)
TOTAL PROTEIN: 7.7 g/dL (ref 6.0–8.3)
Total Bilirubin: 0.6 mg/dL (ref 0.3–1.2)

## 2013-11-25 LAB — POCT URINALYSIS DIPSTICK
BILIRUBIN UA: NEGATIVE
Glucose, UA: NEGATIVE
KETONES UA: NEGATIVE
LEUKOCYTES UA: NEGATIVE
Nitrite, UA: NEGATIVE
PH UA: 5.5
PROTEIN UA: NEGATIVE
RBC UA: NEGATIVE
Spec Grav, UA: 1.025
Urobilinogen, UA: 0.2

## 2013-11-25 LAB — CBC WITH DIFFERENTIAL/PLATELET
BASOS ABS: 0 10*3/uL (ref 0.0–0.1)
Basophils Relative: 0.4 % (ref 0.0–3.0)
EOS PCT: 3.1 % (ref 0.0–5.0)
Eosinophils Absolute: 0.2 10*3/uL (ref 0.0–0.7)
HCT: 44 % (ref 39.0–52.0)
Hemoglobin: 14.8 g/dL (ref 13.0–17.0)
Lymphocytes Relative: 31.7 % (ref 12.0–46.0)
Lymphs Abs: 2 10*3/uL (ref 0.7–4.0)
MCHC: 33.6 g/dL (ref 30.0–36.0)
MCV: 86 fl (ref 78.0–100.0)
MONO ABS: 0.4 10*3/uL (ref 0.1–1.0)
MONOS PCT: 6.3 % (ref 3.0–12.0)
NEUTROS PCT: 58.5 % (ref 43.0–77.0)
Neutro Abs: 3.7 10*3/uL (ref 1.4–7.7)
PLATELETS: 294 10*3/uL (ref 150.0–400.0)
RBC: 5.11 Mil/uL (ref 4.22–5.81)
RDW: 12.9 % (ref 11.5–14.6)
WBC: 6.3 10*3/uL (ref 4.5–10.5)

## 2013-11-25 LAB — BASIC METABOLIC PANEL
BUN: 16 mg/dL (ref 6–23)
CO2: 27 meq/L (ref 19–32)
Calcium: 9.1 mg/dL (ref 8.4–10.5)
Chloride: 106 mEq/L (ref 96–112)
Creatinine, Ser: 1.3 mg/dL (ref 0.4–1.5)
GFR: 65.05 mL/min (ref >60.00)
GLUCOSE: 96 mg/dL (ref 70–99)
POTASSIUM: 4 meq/L (ref 3.5–5.1)
SODIUM: 140 meq/L (ref 135–145)

## 2013-11-25 LAB — TSH: TSH: 1.45 u[IU]/mL (ref 0.35–5.50)

## 2013-12-02 ENCOUNTER — Encounter: Payer: 59 | Admitting: Family Medicine

## 2014-01-21 ENCOUNTER — Ambulatory Visit (INDEPENDENT_AMBULATORY_CARE_PROVIDER_SITE_OTHER): Payer: 59 | Admitting: Family Medicine

## 2014-01-21 ENCOUNTER — Encounter: Payer: Self-pay | Admitting: Family Medicine

## 2014-01-21 VITALS — BP 120/80 | Temp 98.6°F | Ht 71.0 in | Wt 198.0 lb

## 2014-01-21 DIAGNOSIS — Z0000 Encounter for general adult medical examination without abnormal findings: Secondary | ICD-10-CM | POA: Insufficient documentation

## 2014-01-21 NOTE — Progress Notes (Signed)
   Subjective:    Patient ID: Tanner Bradshaw, male    DOB: 08/04/1976, 38 y.o.   MRN: [id number]  Tanner LauthHPIkelvin is a 38 year old male nonsmoker who comes in today for general physical examination  He's always been in excellent health he said no chronic health problems.  He takes no medications on a regular basis  This tetanus booster 2007  He works at the [employer]. He exercises 4 days a week at the gym    Review of Systems  Constitutional: Negative.   HENT: Negative.   Eyes: Negative.   Respiratory: Negative.   Cardiovascular: Negative.   Gastrointestinal: Negative.   Genitourinary: Negative.   Musculoskeletal: Negative.   Skin: Negative.   Neurological: Negative.   Psychiatric/Behavioral: Negative.        Objective:   Physical Exam  Constitutional: He is oriented to person, place, and time. He appears well-developed and well-nourished.  HENT:  Head: Normocephalic and atraumatic.  Right Ear: External ear normal.  Left Ear: External ear normal.  Nose: Nose normal.  Mouth/Throat: Oropharynx is clear and moist.  Eyes: Conjunctivae and EOM are normal. Pupils are equal, round, and reactive to light.  Neck: Normal range of motion. Neck supple. No JVD present. No tracheal deviation present. No thyromegaly present.  Cardiovascular: Normal rate, regular rhythm, normal heart sounds and intact distal pulses.  Exam reveals no gallop and no friction rub.   No murmur heard. Pulmonary/Chest: Effort normal and breath sounds normal. No stridor. No respiratory distress. He has no wheezes. He has no rales. He exhibits no tenderness.  Abdominal: Soft. Bowel sounds are normal. He exhibits no distension and no mass. There is no tenderness. There is no rebound and no guarding.  Genitourinary: Penis normal.  Musculoskeletal: Normal range of motion. He exhibits no edema and no tenderness.  Lymphadenopathy:    He has no cervical adenopathy.  Neurological: He is alert and oriented to  person, place, and time. He has normal reflexes. No cranial nerve deficit. He exhibits normal muscle tone.  Skin: Skin is warm and dry. No rash noted. No erythema. No pallor.  Psychiatric: He has a normal mood and affect. His behavior is normal. Judgment and thought content normal.          Assessment & Plan:  Healthy male  Screening HIV test per patient request

## 2014-01-21 NOTE — Patient Instructions (Signed)
Continue your good health habits  Return in 2 years for general physical examination when you turn 40

## 2014-01-21 NOTE — Progress Notes (Signed)
Pre visit review using our clinic review tool, if applicable. No additional management support is needed unless otherwise documented below in the visit note. 

## 2014-01-22 LAB — HIV ANTIBODY (ROUTINE TESTING W REFLEX): HIV 1&2 Ab, 4th Generation: NONREACTIVE

## 2014-03-23 ENCOUNTER — Emergency Department (INDEPENDENT_AMBULATORY_CARE_PROVIDER_SITE_OTHER): Payer: 59

## 2014-03-23 ENCOUNTER — Emergency Department (HOSPITAL_COMMUNITY)
Admission: EM | Admit: 2014-03-23 | Discharge: 2014-03-23 | Disposition: A | Discharge status: Home / Self Care | Payer: 59 | Source: Home / Self Care | Attending: Emergency Medicine | Admitting: Emergency Medicine

## 2014-03-23 ENCOUNTER — Encounter (HOSPITAL_COMMUNITY): Payer: Self-pay | Admitting: Emergency Medicine

## 2014-03-23 DIAGNOSIS — X58XXXA Exposure to other specified factors, initial encounter: Secondary | ICD-10-CM

## 2014-03-23 DIAGNOSIS — IMO0002 Reserved for concepts with insufficient information to code with codable children: Secondary | ICD-10-CM

## 2014-03-23 DIAGNOSIS — S86899A Other injury of other muscle(s) and tendon(s) at lower leg level, unspecified leg, initial encounter: Secondary | ICD-10-CM

## 2014-03-23 MED ORDER — IBUPROFEN 800 MG PO TABS: 800.0000 mg | ORAL_TABLET | Freq: Three times a day (TID) | ORAL | Status: DC

## 2014-03-23 NOTE — ED Notes (Signed)
C/o   Lower left leg pain from knee down to top of foot x 3 days.  States "more like a numbness type pain"   Denies injury.  No swelling.

## 2014-03-23 NOTE — ED Provider Notes (Signed)
CSN: [id number]     Arrival date & time 03/23/14  1540 History   First MD Initiated Contact with Patient 03/23/14 1740     Chief Complaint  Patient presents with  . Leg Pain   (Consider location/radiation/quality/duration/timing/severity/associated sxs/prior Treatment) Patient is a 38 y.o. male presenting with leg pain. The history is provided by the patient. No language interpreter was used.  Leg Pain Location:  Leg Time since incident:  3 days Injury: no   Leg location:  L lower leg Pain details:    Quality:  Aching   Radiates to:  Does not radiate   Severity:  Moderate   Onset quality:  Gradual   Duration:  3 days   Timing:  Constant   Progression:  Worsening Chronicity:  New Dislocation: no   Foreign body present:  No foreign bodies Prior injury to area:  No Relieved by:  Nothing Worsened by:  Nothing tried Pt run and exercises daily  (eliptical ) Pt has pain in mid shin area  Past Medical History  Diagnosis Date  . Allergy   . GERD (gastroesophageal reflux disease)    History reviewed. No pertinent past surgical history. Family History  Problem Relation Age of Onset  . Diabetes Maternal Aunt   . Colon cancer Neg Hx    History  Substance Use Topics  . Smoking status: Never Smoker   . Smokeless tobacco: Never Used  . Alcohol Use: No    Review of Systems  All other systems reviewed and are negative.   Allergies  Review of patient's allergies indicates no known allergies.  Home Medications   Prior to Admission medications   Medication Sig Start Date End Date Taking? Authorizing Provider  Ascorbic Acid (VITAMIN C) 1000 MG tablet Take 1,000 mg by mouth daily.    Historical Provider, MD  ibuprofen (ADVIL,MOTRIN) 600 MG tablet Take 1 tablet (600 mg total) by mouth every 8 (eight) hours as needed for pain. 02/15/13   Jonita Albee, MD  Multiple Vitamin (MULTI-VITAMINS PO) Take 1 tablet by mouth daily.    Historical Provider, MD   BP 131/76  Pulse 61   Temp(Src) 98.1 F (36.7 C) (Oral)  Resp 12  SpO2 100% Physical Exam  Nursing note and vitals reviewed. Constitutional: He appears well-developed and well-nourished.  HENT:  Head: Normocephalic.  Musculoskeletal: He exhibits tenderness.  Tender mid shin,  Slight swelling  Neurological: He is alert.  Skin: Skin is warm.    ED Course  Procedures (including critical care time) Labs Review Labs Reviewed - No data to display  Imaging Review Dg Tibia/fibula Left  03/23/2014   CLINICAL DATA:  Leg pain, no known injury  EXAM: LEFT TIBIA AND FIBULA - 2 VIEW  COMPARISON:  None.  FINDINGS: No fracture or dislocation is seen.  The joint spaces are preserved.  The visualized soft tissues are unremarkable.  IMPRESSION: No acute osseus abnormality is seen.   Electronically Signed   By: Charline Bills M.D.   On: 03/23/2014 18:26     MDM   1. Shin splints    Ibuprofen.   Referral to orthopaedist if pain persist.      Elson Areas, PA-C 03/23/14 2102

## 2014-03-23 NOTE — Discharge Instructions (Signed)
Shin Splints Shin splints is a painful condition that is felt on the shinbone or in the muscles on either side of the bone (front of your lower leg). Shin splints happen when physical activities, such as sports or other demanding exercise, leads to inflammation of the muscles, tendons, and the thin layer that covers the shinbone.  CAUSES   Overuse of muscles.  Repetitive activities.  Flat feet or rigid arches. Activities that could contribute to shin splints include:  A sudden increase in exercise time.  Starting a new, demanding activity.  Running up hills or long distances.  Playing sports with sudden starts and stops.  A poor warm up.  Old or worn-out shoes. SYMPTOMS   Pain on the front of the leg.  Pain while exercising or at rest. DIAGNOSIS  Your caregiver will diagnose shin splints from a history of your symptoms and a physical exam. You may be observed as you walk or run. X-ray exams or further testing may be needed to rule out other problems, such as a stress fracture, which also causes lower leg pain. TREATMENT  Your caregiver may decide on the treatment based on your age, history, health, and how bad the pain is. Most cases of shin splints can be managed by one or more of the following:  Resting.  Reducing the length and intensity of your exercise.  Stopping the activity that causes shin pain.  Taking medicines to control the inflammation.  Icing, massaging, stretching, and strengthening the affected area.  Getting shoes with rigid heels, shock absorption, and a good arch support. HOME CARE INSTRUCTIONS   Resume activity steadily or as directed by your caregiver.  Restart your exercise sessions with non-weight-bearing exercises, such as cycling or swimming.  Stop running if the pain returns.  Warm up properly before exercising.  Run on a level and fairly firm surface.  Gradually change the intensity of an exercise.  Limit increases in running  distance by no more than 5 to 10% weekly. This means if you are running 5 miles, you can only increase your run by 1/2 a mile at a time.  Change your athletic shoes every 6 months, or every 350 to 450 miles. SEEK MEDICAL CARE IF:   Symptoms continue or worsen even after treatment.  The location, intensity, or type of pain changes over time. SEEK IMMEDIATE MEDICAL CARE IF:   You have severe pain.  You have trouble walking. MAKE SURE YOU:  Understand these instructions.  Will watch your condition.  Will get help right away if you are not doing well or get worse. Document Released: 09/29/2000 Document Revised: 12/25/2011 Document Reviewed: 03/19/2011 [employer] Patient Information 2014 [employer].  

## 2014-03-24 NOTE — ED Provider Notes (Signed)
Medical screening examination/treatment/procedure(s) were performed by non-physician practitioner and as supervising physician I was immediately available for consultation/collaboration.  Armour Villanueva, M.D.  Janson Lamar C Anja Neuzil, MD 03/24/14 0739 

## 2014-08-13 ENCOUNTER — Telehealth: Payer: Self-pay | Admitting: Family Medicine

## 2014-08-13 ENCOUNTER — Ambulatory Visit (INDEPENDENT_AMBULATORY_CARE_PROVIDER_SITE_OTHER): Payer: 59 | Admitting: Family Medicine

## 2014-08-13 ENCOUNTER — Encounter: Payer: Self-pay | Admitting: Family Medicine

## 2014-08-13 VITALS — BP 130/90 | Temp 98.1°F | Wt 200.0 lb

## 2014-08-13 DIAGNOSIS — J309 Allergic rhinitis, unspecified: Secondary | ICD-10-CM | POA: Insufficient documentation

## 2014-08-13 DIAGNOSIS — J301 Allergic rhinitis due to pollen: Secondary | ICD-10-CM

## 2014-08-13 NOTE — Progress Notes (Signed)
Pre visit review using our clinic review tool, if applicable. No additional management support is needed unless otherwise documented below in the visit note. 

## 2014-08-13 NOTE — Telephone Encounter (Signed)
Pt having sinus issues over a week. Nurse at his work advised him to see his dr after checking him out. Pt will arrive at 4:45pm per rachel.

## 2014-08-13 NOTE — Patient Instructions (Signed)
Zyrtec 10 mg plain..............Marland Kitchen. 1 at bedtime  Afrin nasal spray,,,,,,, one shot up each nostril at bedtime for 5 nights then stop

## 2014-08-13 NOTE — Progress Notes (Signed)
   Subjective:    Patient ID: Tanner Bradshaw, male    DOB: 04/13/1976, 38 y.o.   MRN: [id number]  HPI Tanner Bradshaw is a 38 year old male nonsmoker who works in the lab at the [employer] who comes in today with a week's history of head congestion postnasal drip  He's had a history of seasonal allergic rhinitis no asthma  No fever chills earache sore throat etc. etc.   Review of Systems    review of systems otherwise negative Objective:   Physical Exam  Well-developed well-nourished male no acute distress vital signs stable he's afebrile examination HEENT were negative neck was supple no adenopathy lungs are clear      Assessment & Plan:  Allergic rhinitis............Marland Kitchen. Zyrtec plain...Marland Kitchen.Marland Kitchen.Marland Kitchen. steroid nasal spray when necessary.

## 2014-10-15 ENCOUNTER — Ambulatory Visit: Payer: 59 | Admitting: Family Medicine

## 2015-02-25 ENCOUNTER — Encounter: Payer: Self-pay | Admitting: Family Medicine

## 2015-02-25 ENCOUNTER — Ambulatory Visit (INDEPENDENT_AMBULATORY_CARE_PROVIDER_SITE_OTHER): Payer: 59 | Admitting: Family Medicine

## 2015-02-25 VITALS — BP 120/80 | Temp 98.8°F | Wt 188.0 lb

## 2015-02-25 DIAGNOSIS — J322 Chronic ethmoidal sinusitis: Secondary | ICD-10-CM | POA: Insufficient documentation

## 2015-02-25 DIAGNOSIS — J0120 Acute ethmoidal sinusitis, unspecified: Secondary | ICD-10-CM

## 2015-02-25 MED ORDER — PREDNISONE 20 MG PO TABS: ORAL_TABLET | ORAL | Status: AC

## 2015-02-25 MED ORDER — AMOXICILLIN 875 MG PO TABS: 875.0000 mg | ORAL_TABLET | Freq: Two times a day (BID) | ORAL | Status: AC

## 2015-02-25 NOTE — Patient Instructions (Addendum)
Prednisone 20 mg........... 2 tabs now....... 2 tonight........ then starting tomorrow 2 tabs daily for 3 days....... one for 3 days....... half a pill for 3 days...Marland Kitchen.Marland Kitchen.Marland Kitchen. then a half a tab Monday Wednesday Friday for a two-week taper  If after 4-5 days for sinus pain goes away then continue the prednisone alone............ if after 4-5 days your still having pain at the antibiotic.......... amoxicillin 875 mg twice daily for 10 days  Afrin nasal spray........Marland Kitchen. 1 shot up each nostril at bedtime for 5 nights only....... then stop

## 2015-02-25 NOTE — Progress Notes (Signed)
   Subjective:    Patient ID: Tanner Bradshaw, male    DOB: 03/29/1976, 39 y.o.   MRN: [id number]  HPI Tanner Bradshaw is a 39 year old single male nonsmoker who comes in today with evaluation of pain in his left ethmoid sinuses 3 weeks  He's had a history of underlying allergic rhinitis. 3 weeks ago he began having pain in his left ethmoid sinus area. It's been constant. He's also had some bleeding out of left nostril.  No fever chills etc.   Review of Systems Review of systems otherwise negative    Objective:   Physical Exam Well-developed well-nourished male no acute distress vital signs stable he is afebrile HEENT is pertinent he has some palpable tenderness over the left ethmoid sinus. Nasal septum is in the midline. 4+ nasal edema       Assessment & Plan:  Sinusitis.............. trial of prednisone and nasal decongestants........ if this does not resolve the problem after for 5 days add antibiotic

## 2015-02-25 NOTE — Progress Notes (Signed)
Pre visit review using our clinic review tool, if applicable. No additional management support is needed unless otherwise documented below in the visit note. 

## 2015-08-21 IMAGING — CR DG TIBIA/FIBULA 2V*L*
3 series · 3 of 3 positions shown · non-contrast
Comparison: None.

CLINICAL DATA: Leg pain, no known injury

EXAM:
LEFT TIBIA AND FIBULA - 2 VIEW

[view not recorded (1 of 3)]
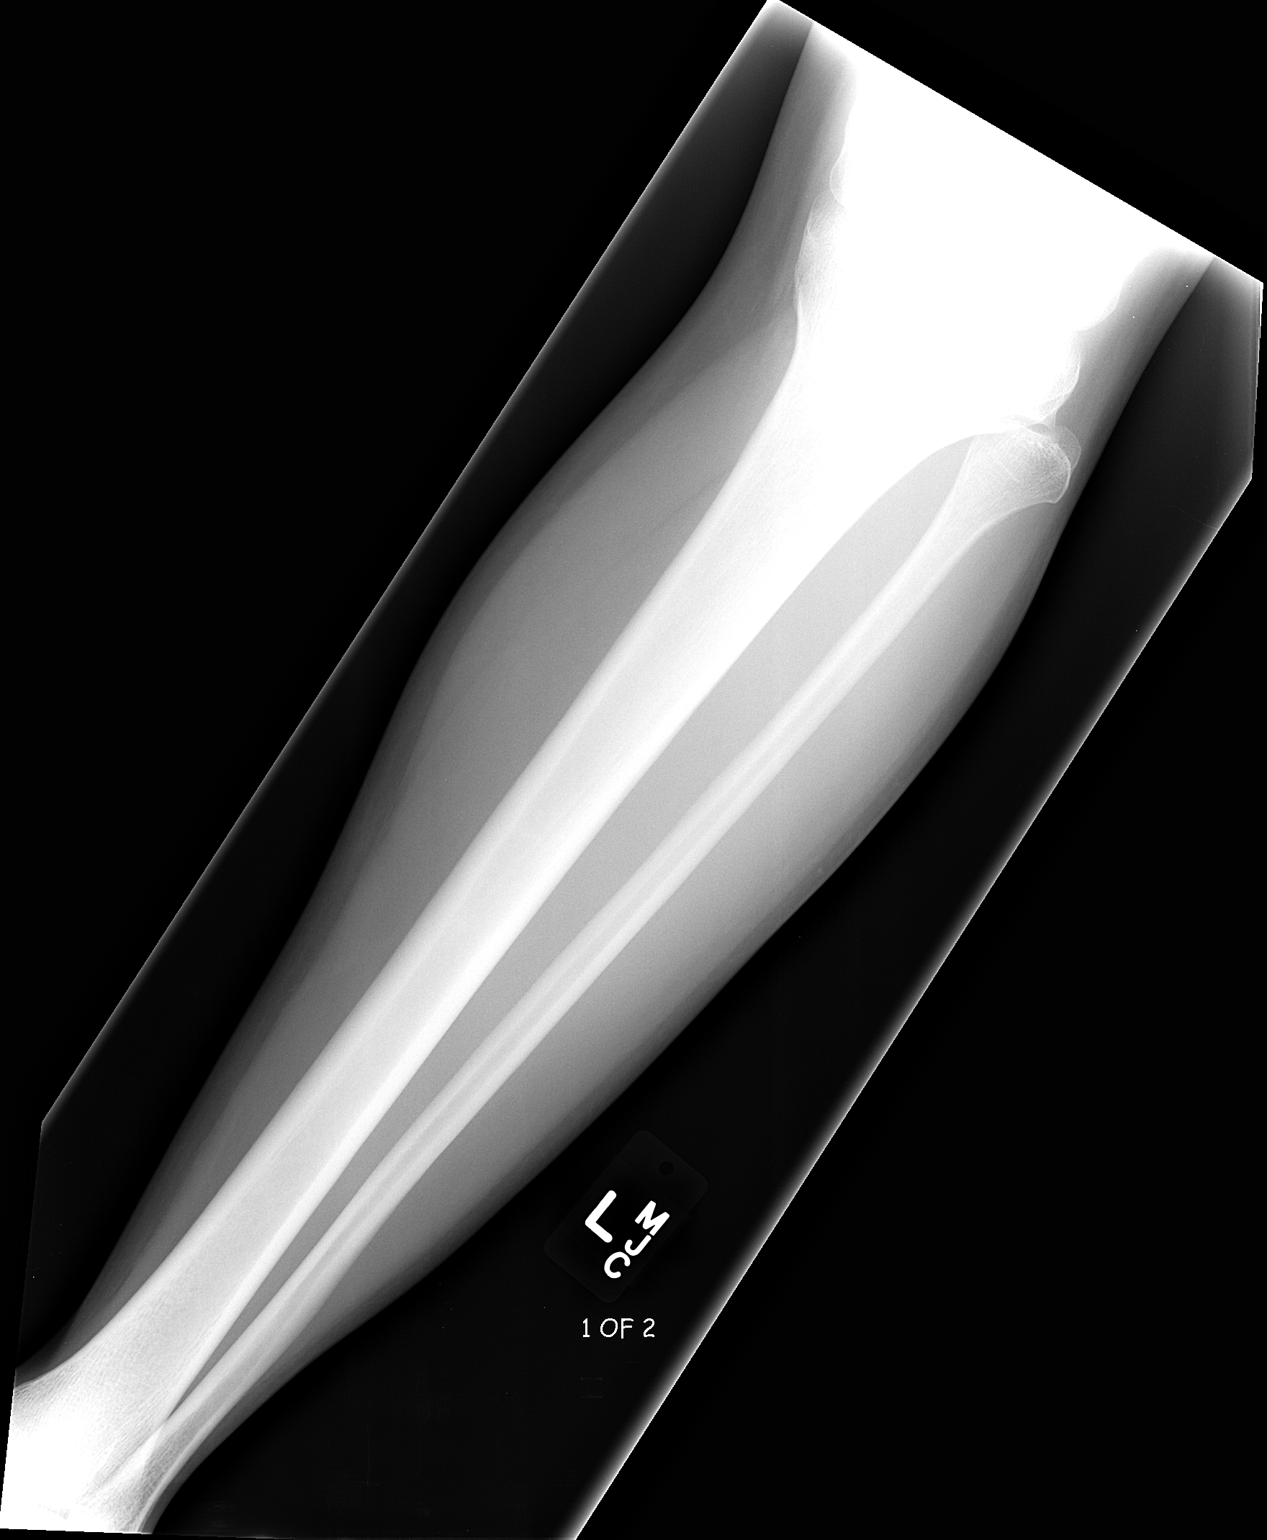

[view not recorded (2 of 3)]
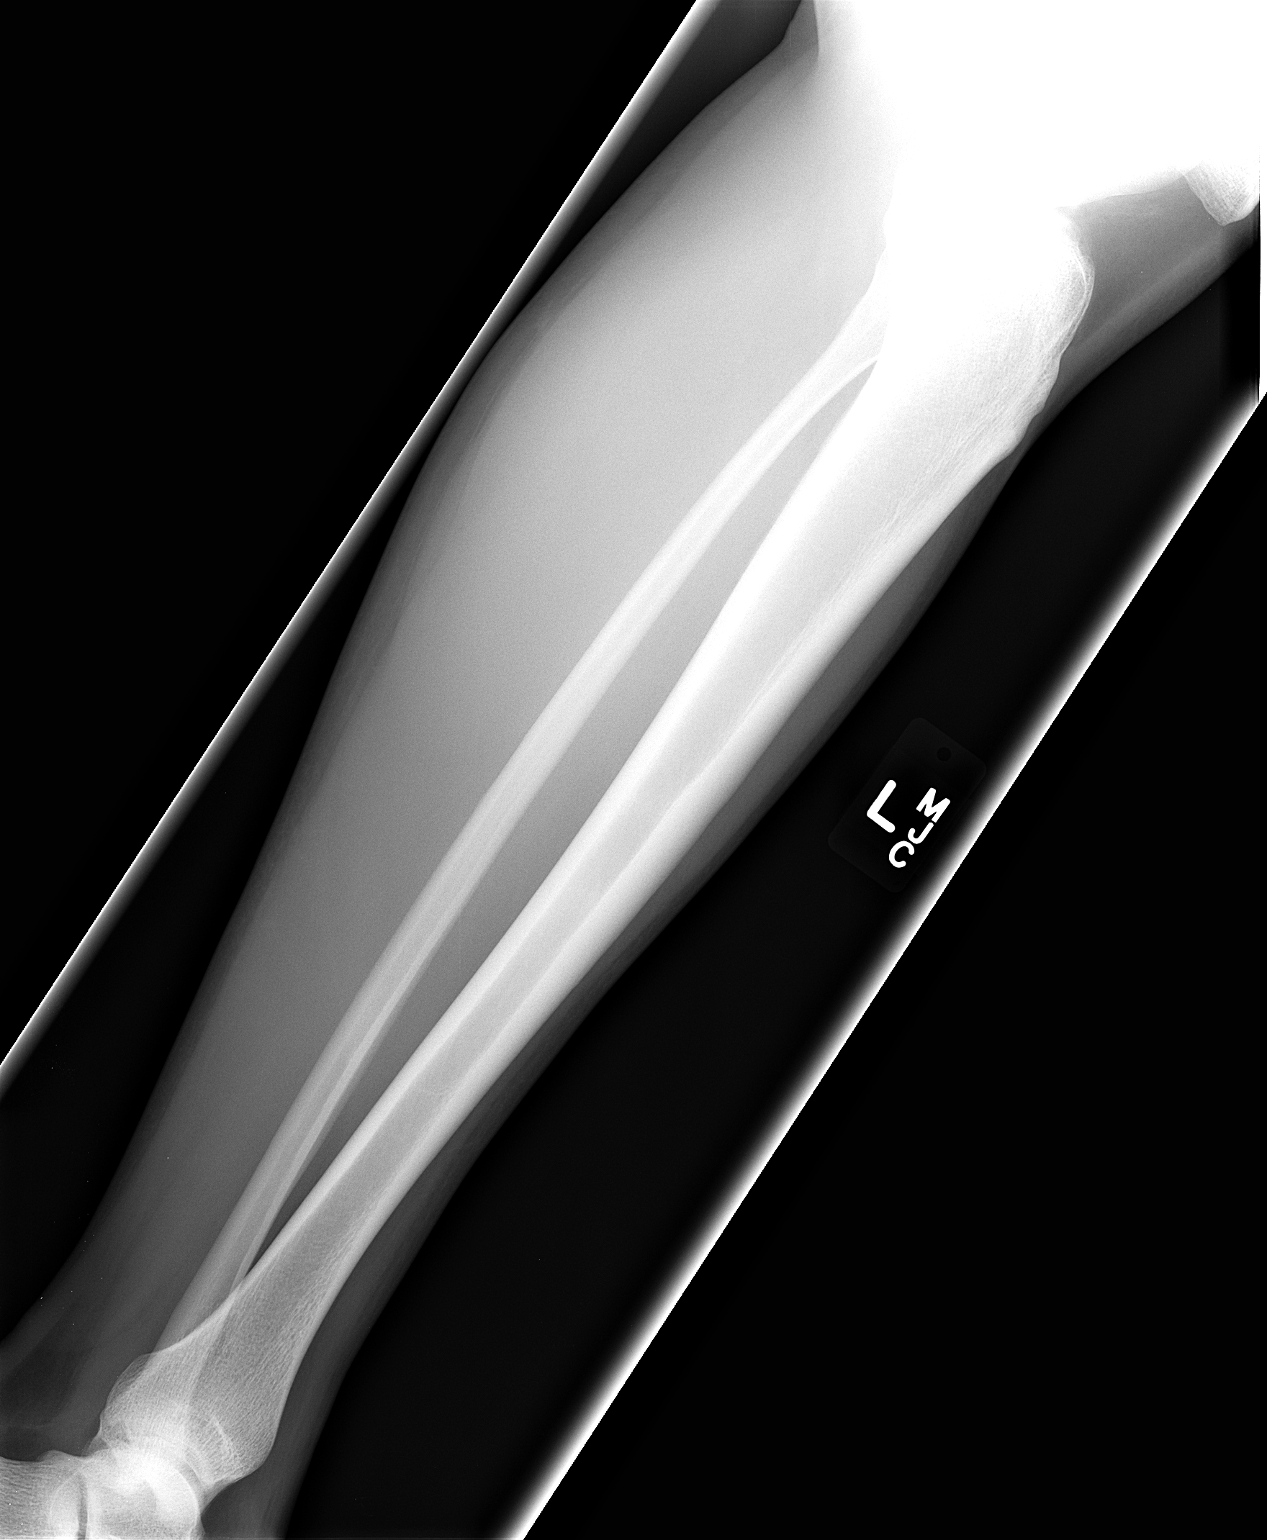

[view not recorded (3 of 3)]
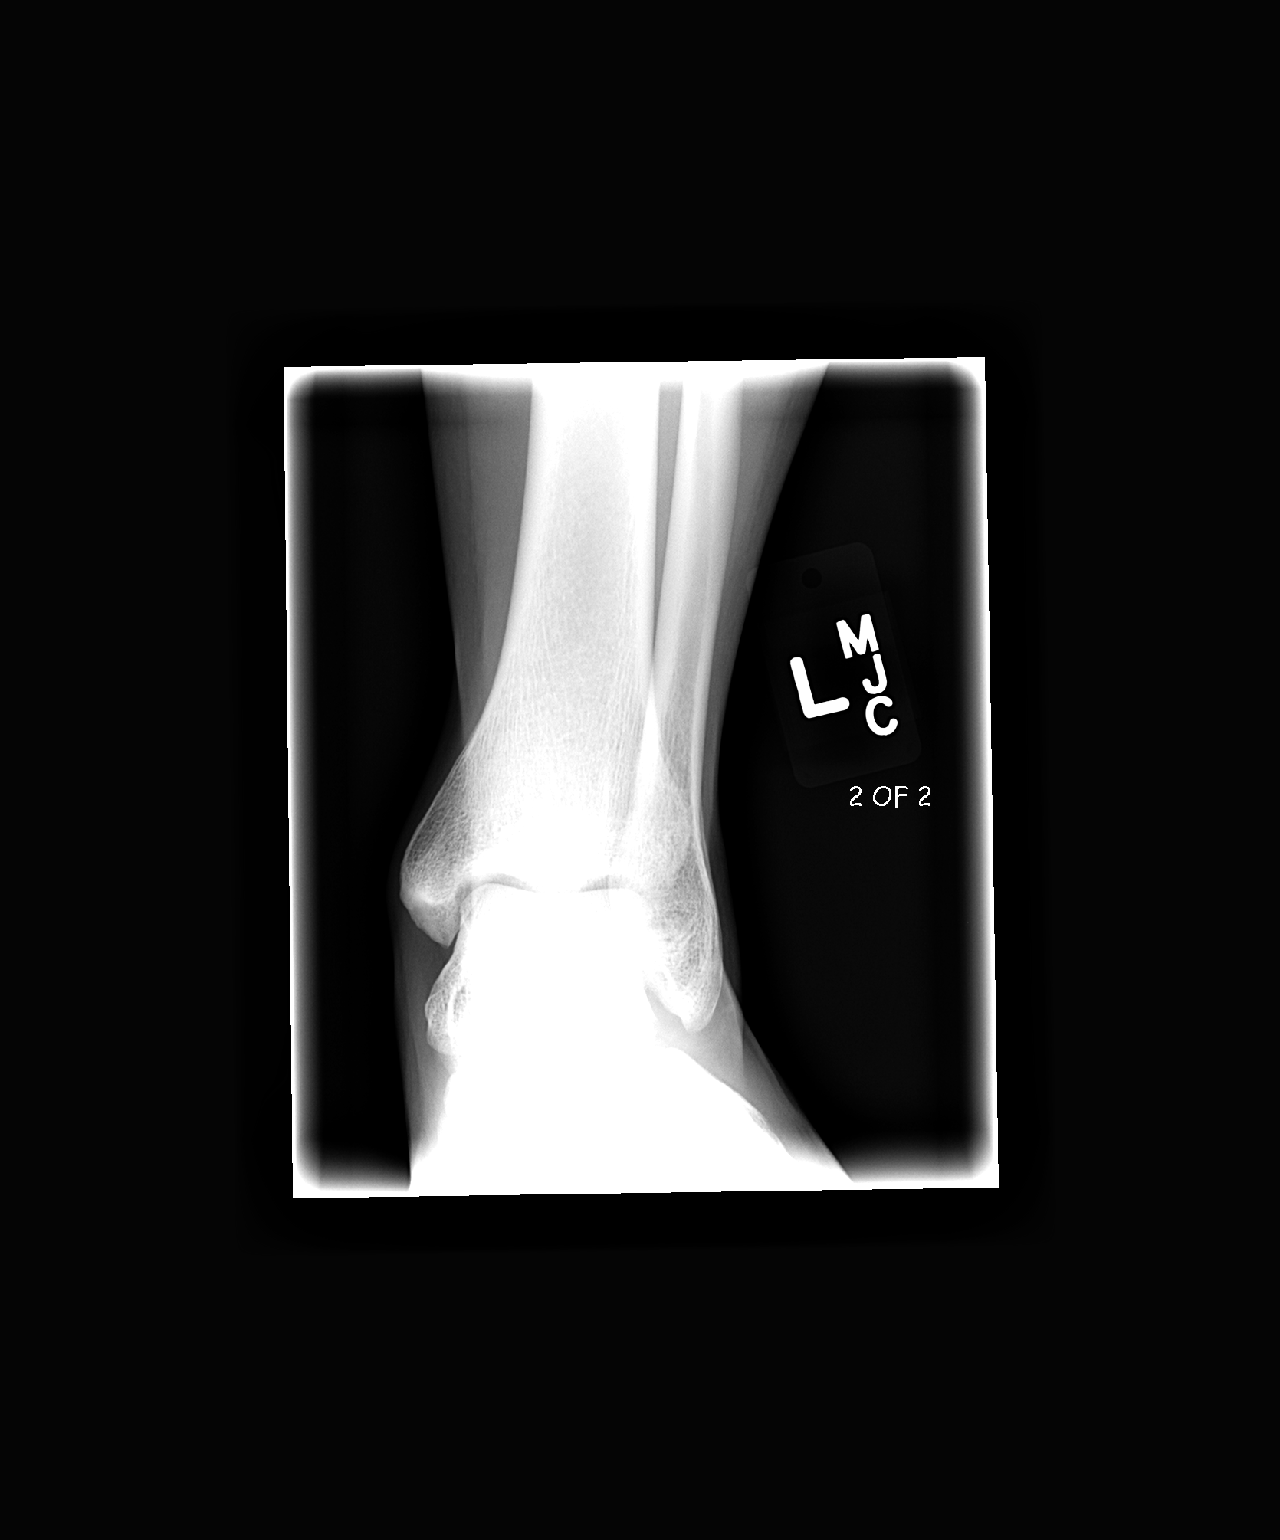

[3 of 3 positions shown; findings below may reference images not displayed]

FINDINGS: No fracture or dislocation is seen.

The joint spaces are preserved.

The visualized soft tissues are unremarkable.
IMPRESSION: No acute osseus abnormality is seen.

## 2022-04-11 ENCOUNTER — Other Ambulatory Visit: Payer: Self-pay | Admitting: Family Medicine

## 2022-04-11 DIAGNOSIS — R109 Unspecified abdominal pain: Secondary | ICD-10-CM

## 2022-04-13 ENCOUNTER — Inpatient Hospital Stay: Admission: RE | Admit: 2022-04-13 | Payer: 59 | Source: Ambulatory Visit

## 2023-08-27 ENCOUNTER — Other Ambulatory Visit
Admission: RE | Admit: 2023-08-27 | Discharge: 2023-08-27 | Disposition: A | Discharge status: Home / Self Care | Payer: 59 | Attending: Physician Assistant | Admitting: Physician Assistant

## 2023-08-27 DIAGNOSIS — E291 Testicular hypofunction: Secondary | ICD-10-CM | POA: Diagnosis present

## 2023-08-31 LAB — TESTOSTERONE,FREE AND TOTAL
Testosterone, Free: 8.4 pg/mL (ref 6.8–21.5)
Testosterone: 445 ng/dL (ref 264–916)
